# Patient Record
Sex: Male | Born: 1965 | Race: White | Hispanic: No | Marital: Single | Smoking: Current every day smoker
Health system: Southern US, Community
[De-identification: ages and names within clinical notes are randomized; demographics above are authoritative.]

## PROBLEM LIST (undated history)

## (undated) HISTORY — PX: BACK SURGERY: SHX140

---

## 1999-07-20 ENCOUNTER — Encounter: Payer: Self-pay | Admitting: Emergency Medicine

## 1999-07-20 ENCOUNTER — Emergency Department (HOSPITAL_COMMUNITY): Admission: EM | Admit: 1999-07-20 | Discharge: 1999-07-20 | Payer: Self-pay | Admitting: Emergency Medicine

## 2004-03-05 ENCOUNTER — Emergency Department: Payer: Self-pay | Admitting: Emergency Medicine

## 2004-12-14 ENCOUNTER — Emergency Department: Payer: Self-pay | Admitting: Internal Medicine

## 2005-06-11 ENCOUNTER — Emergency Department: Payer: Self-pay | Admitting: Emergency Medicine

## 2007-07-19 ENCOUNTER — Emergency Department: Payer: Self-pay | Admitting: Emergency Medicine

## 2008-01-25 ENCOUNTER — Emergency Department: Payer: Self-pay | Admitting: Emergency Medicine

## 2008-07-10 ENCOUNTER — Emergency Department: Payer: Self-pay | Admitting: Internal Medicine

## 2009-03-31 ENCOUNTER — Emergency Department: Payer: Self-pay

## 2010-11-18 IMAGING — CR RIGHT FOREARM - 2 VIEW
1 series · 2 of 2 positions shown · non-contrast
Comparison: none

REASON FOR EXAM: deformity, pain, swelling
COMMENTS:

PROCEDURE:     DXR - DXR FOREARM RIGHT  - March 31, 2009  [DATE]
RESULT:     AP and lateral views of the right forearm reveal the bones to be
adequately mineralized. The observed portions of the elbow and wrist exhibit
no acute abnormality. The overlying soft tissues are normal in appearance.

[Series 1: view not recorded · 0.17mm/px · 2 of 2 slices shown]
[im 1/2]
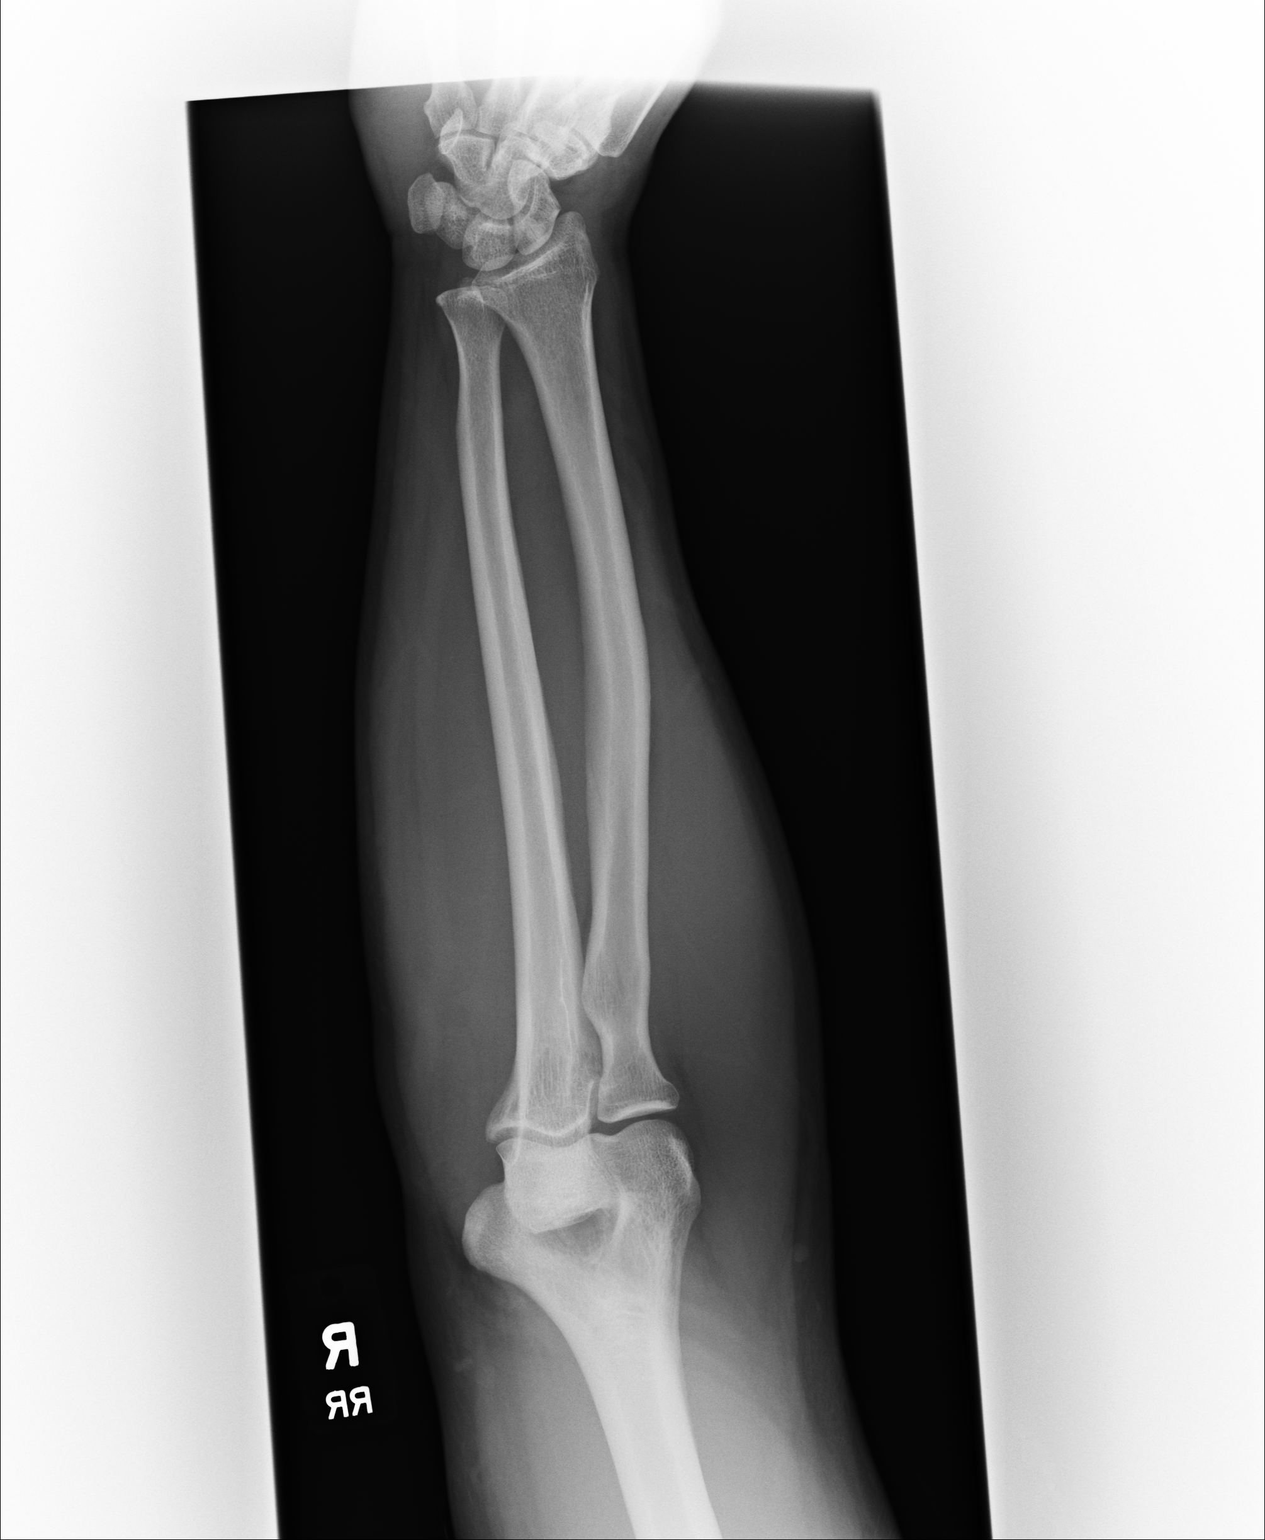
[im 2/2]
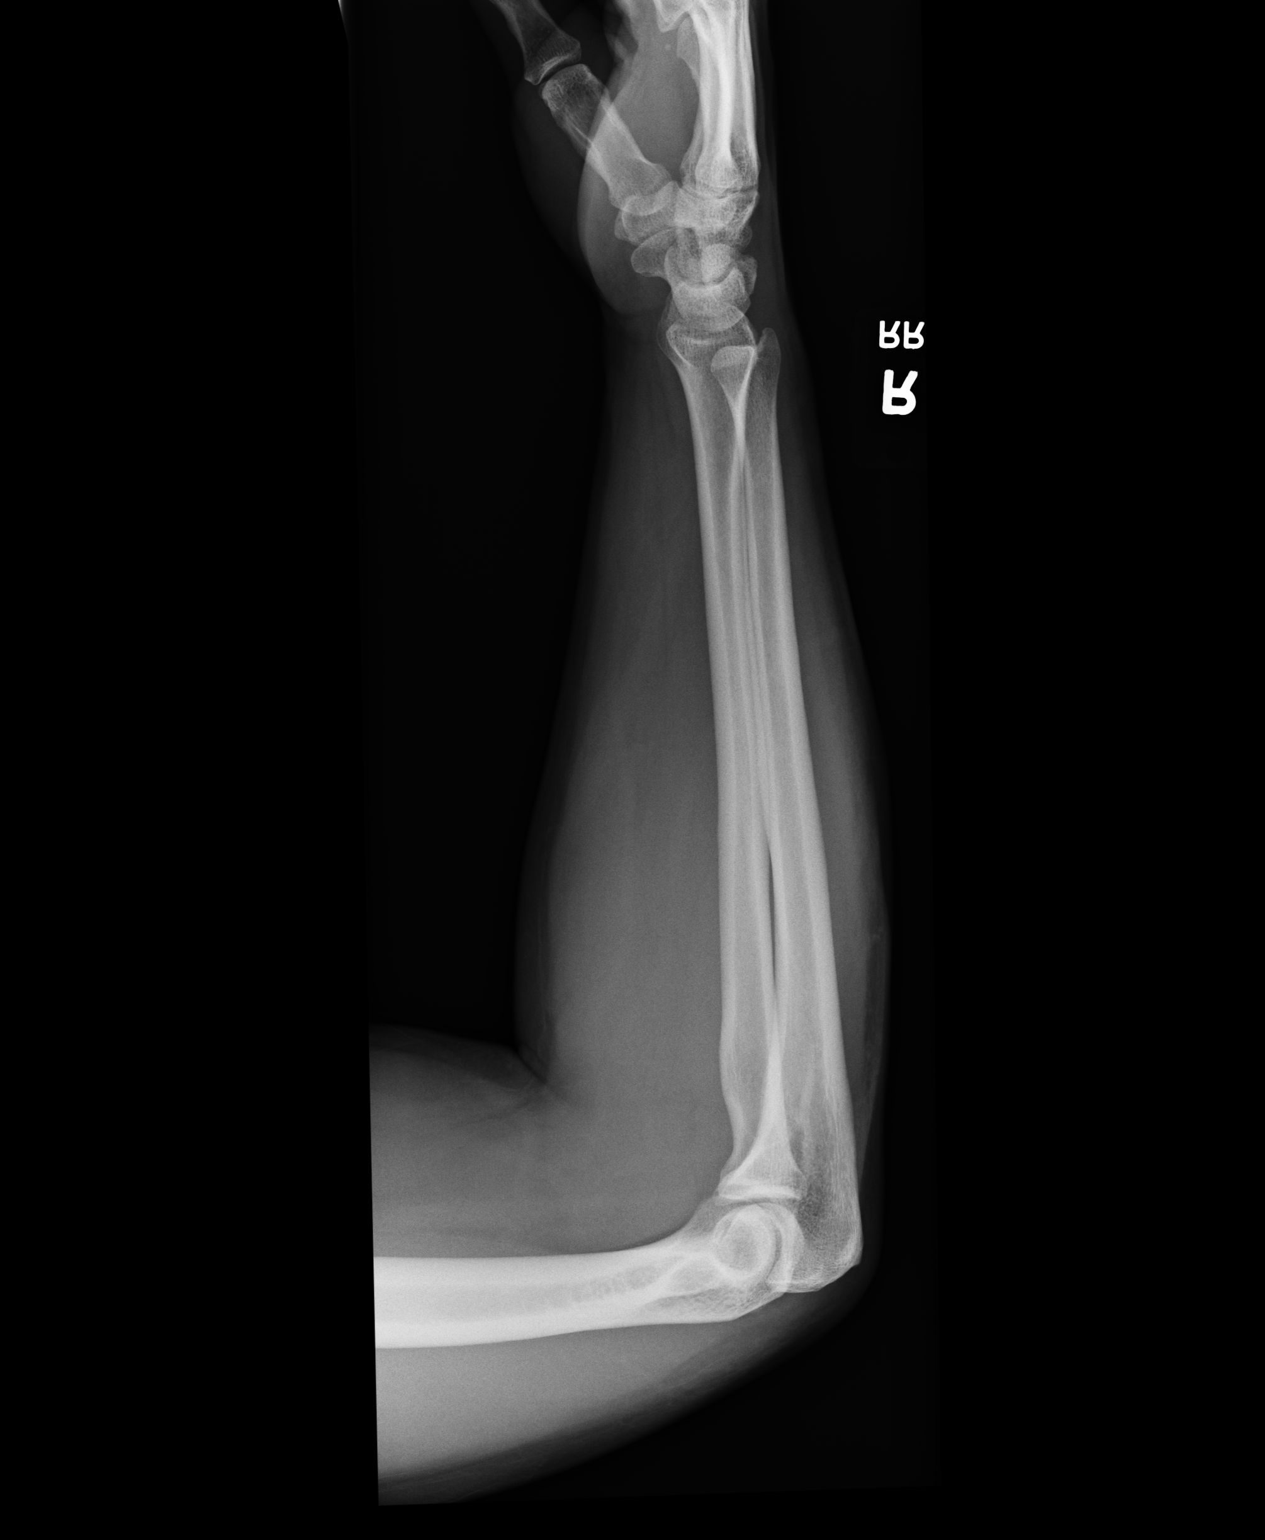

[2 of 2 positions shown; findings below may reference images not displayed]

IMPRESSION: I do not see objective evidence of an acute fracture nor
other acute abnormality of the radius or ulna on the right. No definite soft
tissue lesion is identified.

## 2010-12-05 ENCOUNTER — Emergency Department: Payer: Self-pay | Admitting: Emergency Medicine

## 2011-05-06 ENCOUNTER — Emergency Department: Payer: Self-pay | Admitting: Emergency Medicine

## 2013-06-06 ENCOUNTER — Emergency Department: Payer: Self-pay | Admitting: Emergency Medicine

## 2013-06-06 LAB — BASIC METABOLIC PANEL
Anion Gap: 4 — ABNORMAL LOW (ref 7–16)
BUN: 13 mg/dL (ref 7–18)
Calcium, Total: 9.7 mg/dL (ref 8.5–10.1)
Chloride: 99 mmol/L (ref 98–107)
Co2: 30 mmol/L (ref 21–32)
Creatinine: 0.78 mg/dL (ref 0.60–1.30)
EGFR (African American): >60
EGFR (Non-African Amer.): >60
Glucose: 123 mg/dL — ABNORMAL HIGH (ref 65–99)
Osmolality: 268 (ref 275–301)
Potassium: 3.7 mmol/L (ref 3.5–5.1)
Sodium: 133 mmol/L — ABNORMAL LOW (ref 136–145)

## 2013-06-06 LAB — CBC
HCT: 46.1 % (ref 40.0–52.0)
HGB: 15.1 g/dL (ref 13.0–18.0)
MCH: 30 pg (ref 26.0–34.0)
MCHC: 32.9 g/dL (ref 32.0–36.0)
MCV: 91 fL (ref 80–100)
Platelet: 301 10*3/uL (ref 150–440)
RBC: 5.05 10*6/uL (ref 4.40–5.90)
RDW: 13.1 % (ref 11.5–14.5)
WBC: 17.1 10*3/uL — ABNORMAL HIGH (ref 3.8–10.6)

## 2013-06-06 LAB — TROPONIN I: Troponin-I: <0.02 ng/mL

## 2013-09-27 ENCOUNTER — Emergency Department: Payer: Self-pay | Admitting: Emergency Medicine

## 2014-04-12 ENCOUNTER — Emergency Department: Payer: Self-pay | Admitting: Student

## 2015-01-24 IMAGING — CR DG CHEST 2V
1 series · 2 of 2 positions shown · non-contrast
Comparison: None.

CLINICAL DATA: Shortness of breath.

EXAM:
CHEST  2 VIEW

[Series 1: w chest pa · 0.14mm/px · 2 of 2 slices shown]
[im 1/2]
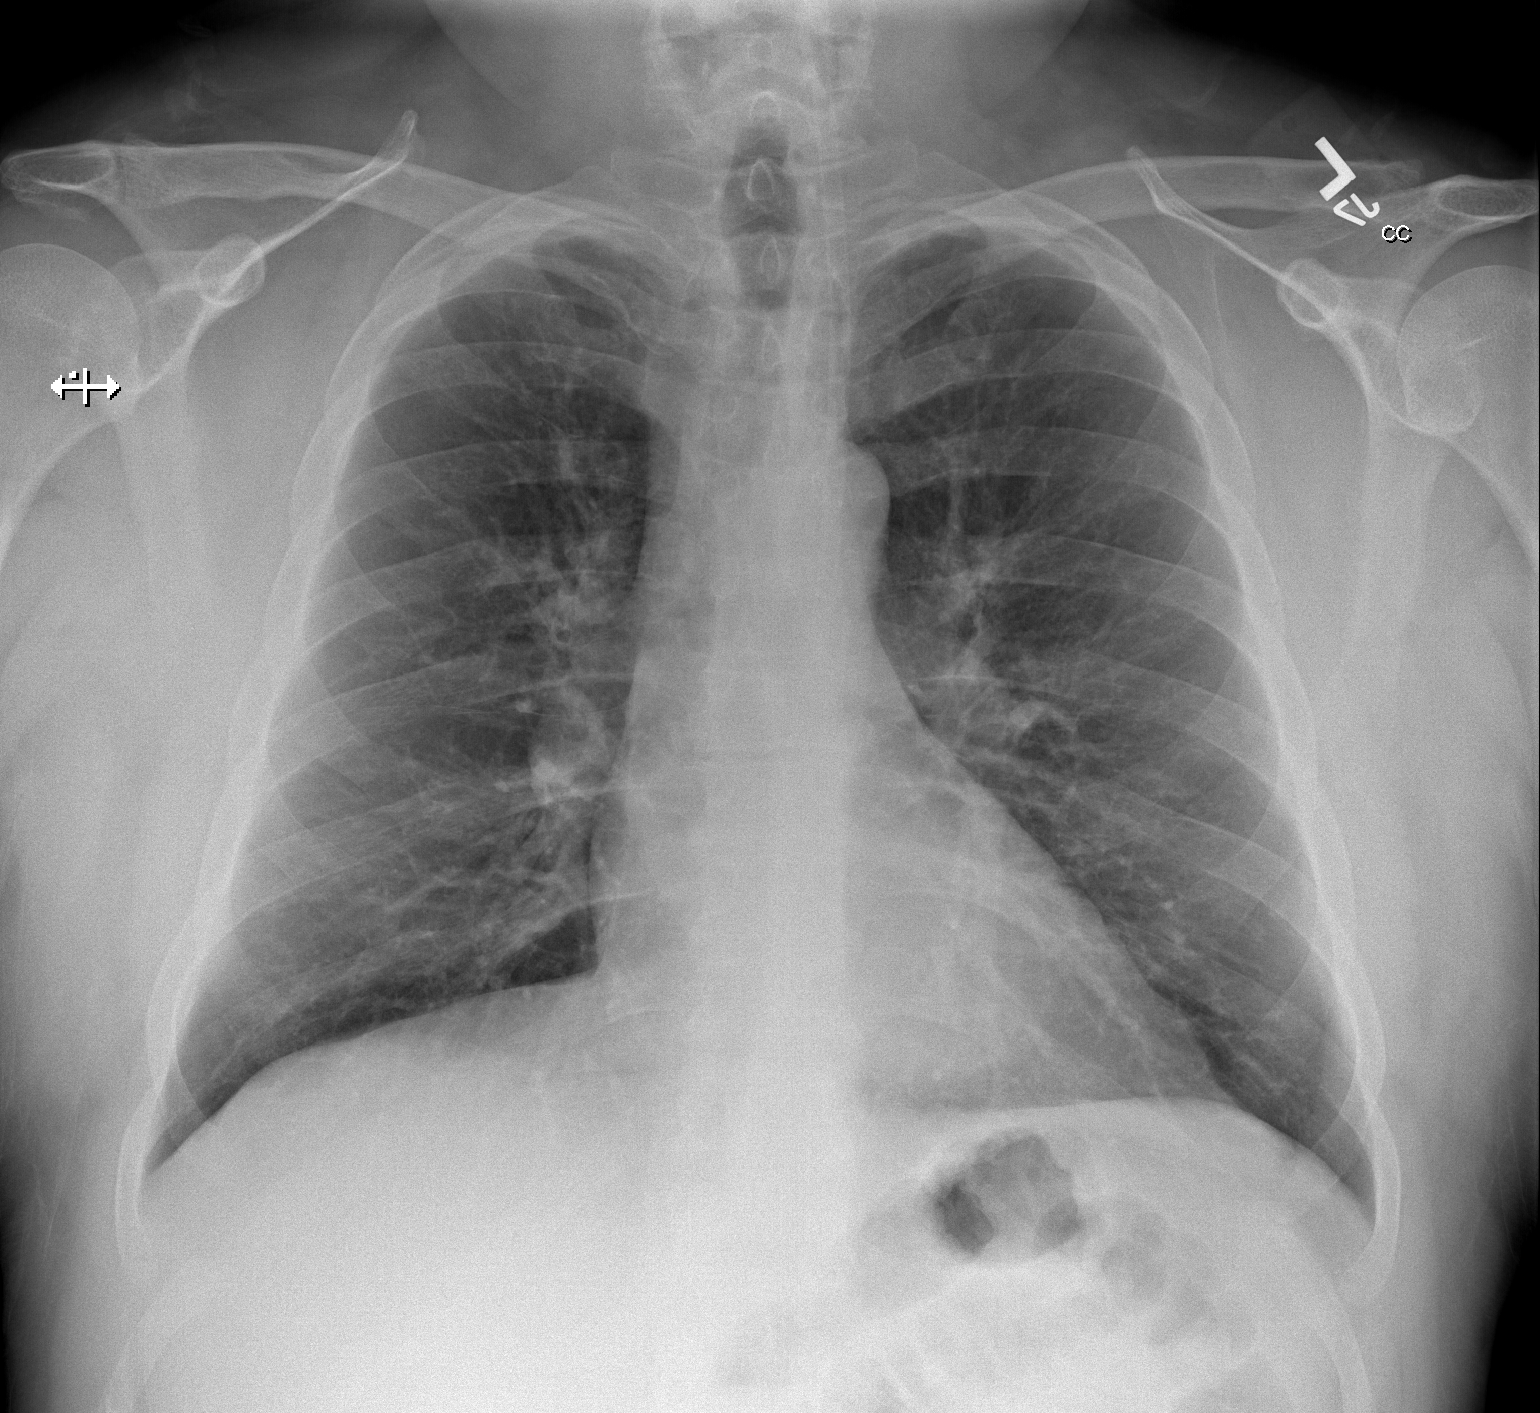
[im 2/2]
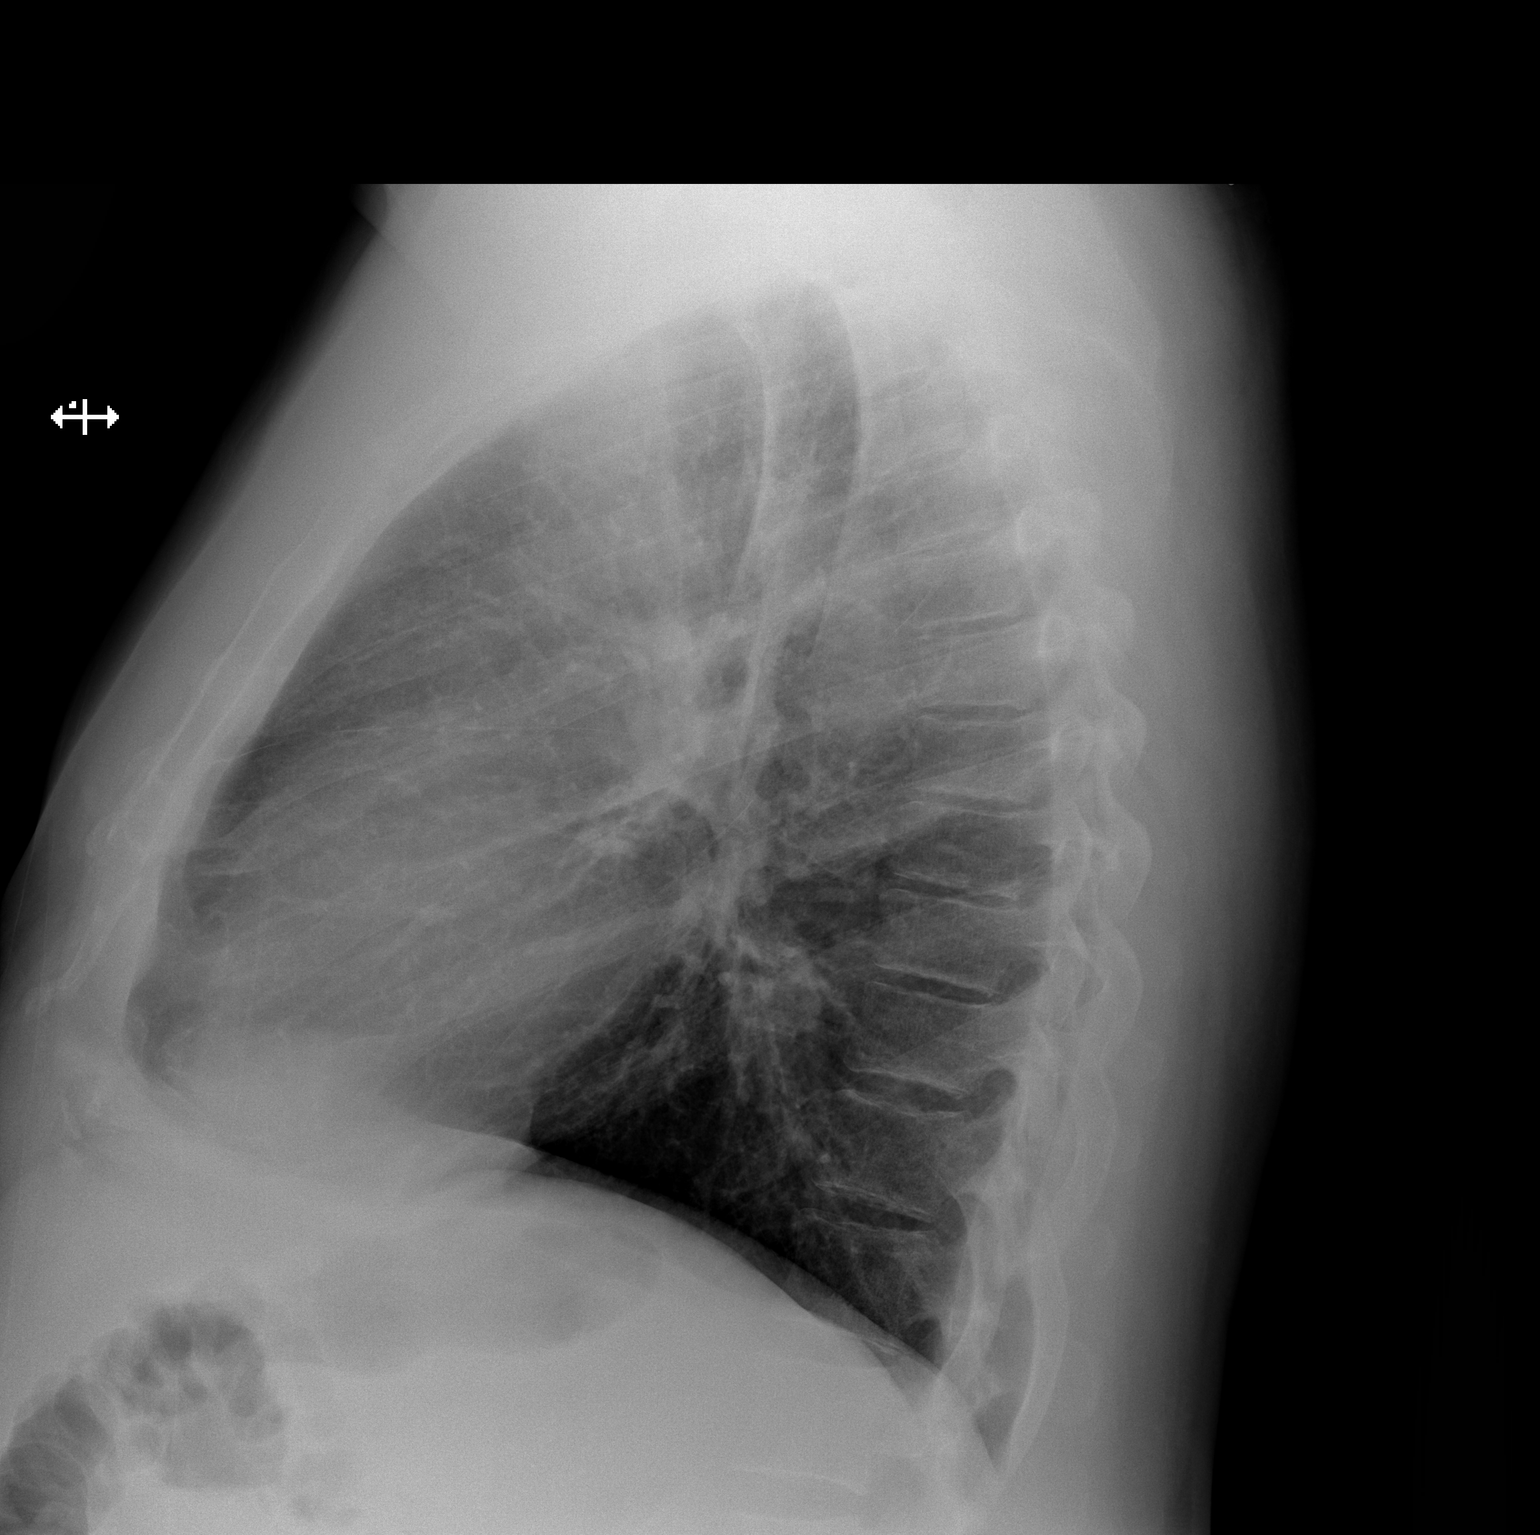

[2 of 2 positions shown; findings below may reference images not displayed]

FINDINGS: The cardiac silhouette, mediastinal and hilar contours are within
normal limits. There is mild peribronchial thickening and increased
interstitial markings which could be related to smoking or
bronchitis. No infiltrates or effusions. The bony thorax is intact.
IMPRESSION: Bronchitic type lung changes could be related to smoking or
bronchitis. No focal infiltrates.

## 2015-09-10 ENCOUNTER — Emergency Department
Admission: EM | Admit: 2015-09-10 | Discharge: 2015-09-10 | Disposition: A | Discharge status: Home / Self Care | Payer: Self-pay | Attending: Emergency Medicine | Admitting: Emergency Medicine

## 2015-09-10 ENCOUNTER — Emergency Department: Payer: Self-pay

## 2015-09-10 ENCOUNTER — Encounter: Payer: Self-pay | Admitting: Emergency Medicine

## 2015-09-10 DIAGNOSIS — F17210 Nicotine dependence, cigarettes, uncomplicated: Secondary | ICD-10-CM | POA: Insufficient documentation

## 2015-09-10 DIAGNOSIS — S91012A Laceration without foreign body, left ankle, initial encounter: Secondary | ICD-10-CM | POA: Insufficient documentation

## 2015-09-10 DIAGNOSIS — Y990 Civilian activity done for income or pay: Secondary | ICD-10-CM | POA: Insufficient documentation

## 2015-09-10 DIAGNOSIS — Y929 Unspecified place or not applicable: Secondary | ICD-10-CM | POA: Insufficient documentation

## 2015-09-10 DIAGNOSIS — W268XXA Contact with other sharp object(s), not elsewhere classified, initial encounter: Secondary | ICD-10-CM | POA: Insufficient documentation

## 2015-09-10 DIAGNOSIS — Y9389 Activity, other specified: Secondary | ICD-10-CM | POA: Insufficient documentation

## 2015-09-10 MED ORDER — IBUPROFEN 800 MG PO TABS
800.0000 mg | ORAL_TABLET | Freq: Once | ORAL | Status: AC
  Administered 2015-09-10: 800 mg via ORAL
  Filled 2015-09-10: qty 1

## 2015-09-10 MED ORDER — LIDOCAINE HCL (PF) 1 % IJ SOLN
INTRAMUSCULAR | Status: AC
  Administered 2015-09-10: 5 mL via INTRADERMAL
  Filled 2015-09-10: qty 5

## 2015-09-10 MED ORDER — OXYCODONE-ACETAMINOPHEN 5-325 MG PO TABS
1.0000 | ORAL_TABLET | Freq: Once | ORAL | Status: AC
  Administered 2015-09-10: 1 via ORAL
  Filled 2015-09-10: qty 1

## 2015-09-10 MED ORDER — SULFAMETHOXAZOLE-TRIMETHOPRIM 800-160 MG PO TABS: 1.0000 | ORAL_TABLET | Freq: Two times a day (BID) | ORAL | Status: AC

## 2015-09-10 MED ORDER — SULFAMETHOXAZOLE-TRIMETHOPRIM 800-160 MG PO TABS
1.0000 | ORAL_TABLET | Freq: Once | ORAL | Status: AC
  Administered 2015-09-10: 1 via ORAL
  Filled 2015-09-10: qty 1

## 2015-09-10 MED ORDER — LIDOCAINE HCL (PF) 1 % IJ SOLN
5.0000 mL | Freq: Once | INTRAMUSCULAR | Status: AC
  Administered 2015-09-10: 5 mL via INTRADERMAL

## 2015-09-10 MED ORDER — TRAMADOL HCL 50 MG PO TABS: 50.0000 mg | ORAL_TABLET | Freq: Four times a day (QID) | ORAL | Status: AC | PRN

## 2015-09-10 NOTE — Discharge Instructions (Signed)
Laceration Care, Adult  A laceration is a cut that goes through all layers of the skin. The cut also goes into the tissue that is right under the skin. Some cuts heal on their own. Others need to be closed with stitches (sutures), staples, skin adhesive strips, or wound glue. Taking care of your cut lowers your risk of infection and helps your cut to heal better.  HOW TO TAKE CARE OF YOUR CUT  For stitches or staples:  · Keep the wound clean and dry.  · If you were given a bandage (dressing), you should change it at least one time per day or as told by your doctor. You should also change it if it gets wet or dirty.  · Keep the wound completely dry for the first 24 hours or as told by your doctor. After that time, you may take a shower or a bath. However, make sure that the wound is not soaked in water until after the stitches or staples have been removed.  · Clean the wound one time each day or as told by your doctor:    Wash the wound with soap and water.    Rinse the wound with water until all of the soap comes off.    Pat the wound dry with a clean towel. Do not rub the wound.  · After you clean the wound, put a thin layer of antibiotic ointment on it as told by your doctor. This ointment:    Helps to prevent infection.    Keeps the bandage from sticking to the wound.  · Have your stitches or staples removed as told by your doctor.  If your doctor used skin adhesive strips:   · Keep the wound clean and dry.  · If you were given a bandage, you should change it at least one time per day or as told by your doctor. You should also change it if it gets dirty or wet.  · Do not get the skin adhesive strips wet. You can take a shower or a bath, but be careful to keep the wound dry.  · If the wound gets wet, pat it dry with a clean towel. Do not rub the wound.  · Skin adhesive strips fall off on their own. You can trim the strips as the wound heals. Do not remove any strips that are still stuck to the wound. They will  fall off after a while.  If your doctor used wound glue:  · Try to keep your wound dry, but you may briefly wet it in the shower or bath. Do not soak the wound in water, such as by swimming.  · After you take a shower or a bath, gently pat the wound dry with a clean towel. Do not rub the wound.  · Do not do any activities that will make you really sweaty until the skin glue has fallen off on its own.  · Do not apply liquid, cream, or ointment medicine to your wound while the skin glue is still on.  · If you were given a bandage, you should change it at least one time per day or as told by your doctor. You should also change it if it gets dirty or wet.  · If a bandage is placed over the wound, do not let the tape for the bandage touch the skin glue.  · Do not pick at the glue. The skin glue usually stays on for 5-10 days. Then, it   falls off of the skin.  General Instructions   · To help prevent scarring, make sure to cover your wound with sunscreen whenever you are outside after stitches are removed, after adhesive strips are removed, or when wound glue stays in place and the wound is healed. Make sure to wear a sunscreen of at least 30 SPF.  · Take over-the-counter and prescription medicines only as told by your doctor.  · If you were given antibiotic medicine or ointment, take or apply it as told by your doctor. Do not stop using the antibiotic even if your wound is getting better.  · Do not scratch or pick at the wound.  · Keep all follow-up visits as told by your doctor. This is important.  · Check your wound every day for signs of infection. Watch for:    Redness, swelling, or pain.    Fluid, blood, or pus.  · Raise (elevate) the injured area above the level of your heart while you are sitting or lying down, if possible.  GET HELP IF:  · You got a tetanus shot and you have any of these problems at the injection site:    Swelling.    Very bad pain.    Redness.    Bleeding.  · You have a fever.  · A wound that was  closed breaks open.  · You notice a bad smell coming from your wound or your bandage.  · You notice something coming out of the wound, such as wood or glass.  · Medicine does not help your pain.  · You have more redness, swelling, or pain at the site of your wound.  · You have fluid, blood, or pus coming from your wound.  · You notice a change in the color of your skin near your wound.  · You need to change the bandage often because fluid, blood, or pus is coming from the wound.  · You start to have a new rash.  · You start to have numbness around the wound.  GET HELP RIGHT AWAY IF:  · You have very bad swelling around the wound.  · Your pain suddenly gets worse and is very bad.  · You notice painful lumps near the wound or on skin that is anywhere on your body.  · You have a red streak going away from your wound.  · The wound is on your hand or foot and you cannot move a finger or toe like you usually can.  · The wound is on your hand or foot and you notice that your fingers or toes look pale or bluish.     This information is not intended to replace advice given to you by your health care provider. Make sure you discuss any questions you have with your health care provider.     Document Released: 10/01/2007 Document Revised: 08/29/2014 Document Reviewed: 04/10/2014  [employer]vier Interactive Patient Education ©2016 [employer]

## 2015-09-10 NOTE — ED Notes (Signed)
Patient ambulatory to triage with steady gait, without difficulty or distress noted; pt reports at 10am cut left lower leg on saw while at work; declines desire to file workers comp at this time

## 2015-09-10 NOTE — ED Provider Notes (Signed)
[facility] Emergency Department Provider Note   ____________________________________________  Time seen: Approximately 8:36 PM  I have reviewed the triage vital signs and the nursing notes.   HISTORY  Chief Complaint Laceration    HPI Joe Gentry is a 50 y.o. male patient complained of pain secondary to a chain saw while cutting a metal bar. Incident occurred at roughly 11 hours ago. Patient state hemorrhage is controlled with direct pressure. Patient stated pain has increased over the last 3 hours. No other palliative measures for his complaint. Incident occurred at work. Patient does not want to file Worker's Compensation at this time. Patient rates his pain discomfort as 8/10. Patient described a pain as "sharp".   History reviewed. No pertinent past medical history.  There are no active problems to display for this patient.   Past Surgical History  Procedure Laterality Date  . Back surgery      Current Outpatient Rx  Name  Route  Sig  Dispense  Refill  . sulfamethoxazole-trimethoprim (BACTRIM DS,SEPTRA DS) 800-160 MG tablet   Oral   Take 1 tablet by mouth 2 (two) times daily.   20 tablet   0   . traMADol (ULTRAM) 50 MG tablet   Oral   Take 1 tablet (50 mg total) by mouth every 6 (six) hours as needed for moderate pain.   12 tablet   0     Allergies Review of patient's allergies indicates no known allergies.  History reviewed. No pertinent family history.  Social History Social History  Substance Use Topics  . Smoking status: Current Every Day Smoker -- 1.00 packs/day    Types: Cigarettes  . Smokeless tobacco: None  . Alcohol Use: Yes     Comment: "couple a day"    Review of Systems Constitutional: No fever/chills Eyes: No visual changes. ENT: No sore throat. Cardiovascular: Denies chest pain. Respiratory: Denies shortness of breath. Gastrointestinal: No abdominal pain.  No nausea, no vomiting.  No  diarrhea.  No constipation. Genitourinary: Negative for dysuria. Musculoskeletal: Negative for back pain. Skin: Negative for rash.Left ankle laceration Neurological: Negative for headaches, focal weakness or numbness.    ____________________________________________   PHYSICAL EXAM:  VITAL SIGNS: ED Triage Vitals  Enc Vitals Group     BP 09/10/15 1940 150/78 mmHg     Pulse Rate 09/10/15 1940 89     Resp 09/10/15 1940 20     Temp 09/10/15 1940 97.9 F (36.6 C)     Temp Source 09/10/15 1940 Oral     SpO2 09/10/15 1940 95 %     Weight 09/10/15 1940 240 lb (108.863 kg)     Height 09/10/15 1940 5\' 7"  (1.702 m)     Head Cir --      Peak Flow --      Pain Score 09/10/15 1940 8     Pain Loc --      Pain Edu? --      Excl. in GC? --     Constitutional: Alert and oriented. Well appearing and in no acute distress. Eyes: Conjunctivae are normal. PERRL. EOMI. Head: Atraumatic. Nose: No congestion/rhinnorhea. Mouth/Throat: Mucous membranes are moist.  Oropharynx non-erythematous. Neck: No stridor.  No cervical spine tenderness to palpation. Hematological/Lymphatic/Immunilogical: No cervical lymphadenopathy. Cardiovascular: Normal rate, regular rhythm. Grossly normal heart sounds.  Good peripheral circulation. Respiratory: Normal respiratory effort.  No retractions. Lungs CTAB. Gastrointestinal: Soft and nontender. No distention. No abdominal bruits. No CVA tenderness. Musculoskeletal: No lower extremity  tenderness nor edema.  No joint effusions. Neurologic:  Normal speech and language. No gross focal neurologic deficits are appreciated. No gait instability. Skin:  Skin is warm, dry and intact. No rash noted.He'll 0.5 cm laceration left medial ankle Psychiatric: Mood and affect are normal. Speech and behavior are normal.  ____________________________________________   LABS (all labs ordered are listed, but only abnormal results are displayed)  Labs Reviewed - No data to  display ____________________________________________  EKG   ____________________________________________  RADIOLOGY  No acute findings or foreign body on x-ray of the left ankle. ____________________________________________   PROCEDURES  Procedure(s) performed: LACERATION REPAIR Performed by: Joni Reining Authorized by: Joni Reining Consent: Verbal consent obtained. Risks and benefits: risks, benefits and alternatives were discussed Consent given by: patient Patient identity confirmed: provided demographic data Prepped and Draped in normal sterile fashion Wound explored  Laceration Location: Left medial ankle  Laceration Length: 0.5cm  No Foreign Bodies seen or palpated  Anesthesia: local infiltration  Local anesthetic: lidocaine 1% without epinephrine  Anesthetic total: 2 ml  Irrigation method: syringe Amount of cleaning: standard  Skin closure: 4-0 nylon   Number of sutures: 3 Technique: Interrupted Patient tolerance: Patient tolerated the procedure well with no immediate complications.   Critical Care performed: No  ____________________________________________   INITIAL IMPRESSION / ASSESSMENT AND PLAN / ED COURSE  Pertinent labs & imaging results that were available during my care of the patient were reviewed by me and considered in my medical decision making (see chart for details).  Left ankle laceration. Patient given discharge care instructions. Patient given a prescription for Bactrim DS and tramadol. Patient advised to have sutures removed by this department or urgent care clinic in 10 days. ____________________________________________   FINAL CLINICAL IMPRESSION(S) / ED DIAGNOSES  Final diagnoses:  Laceration of left ankle, initial encounter      NEW MEDICATIONS STARTED DURING THIS VISIT:  New Prescriptions   SULFAMETHOXAZOLE-TRIMETHOPRIM (BACTRIM DS,SEPTRA DS) 800-160 MG TABLET    Take 1 tablet by mouth 2 (two) times daily.    TRAMADOL (ULTRAM) 50 MG TABLET    Take 1 tablet (50 mg total) by mouth every 6 (six) hours as needed for moderate pain.     Note:  This document was prepared using Dragon voice recognition software and may include unintentional dictation errors.    Joni Reining, PA-C 09/10/15 2203  Phineas Semen, MD 09/10/15 2250

## 2015-09-10 NOTE — ED Notes (Addendum)
Pt states he was working with a sawzall this AM when the equipment slipped and went into the pts left ankle. Puncture wound noted on assessment, no bleeding, swelling and tenderness present.

## 2016-01-27 DEATH — deceased

## 2017-04-29 IMAGING — DX DG ANKLE COMPLETE 3+V*L*
3 series · 3 of 3 positions shown · non-contrast
Comparison: None

CLINICAL DATA: Struck medial LEFT ankle with reciprocating saw that
with still running after cutting through a metal rod, laceration,
initial encounter

EXAM:
LEFT ANKLE COMPLETE - 3+ VIEW

[ankle lat]
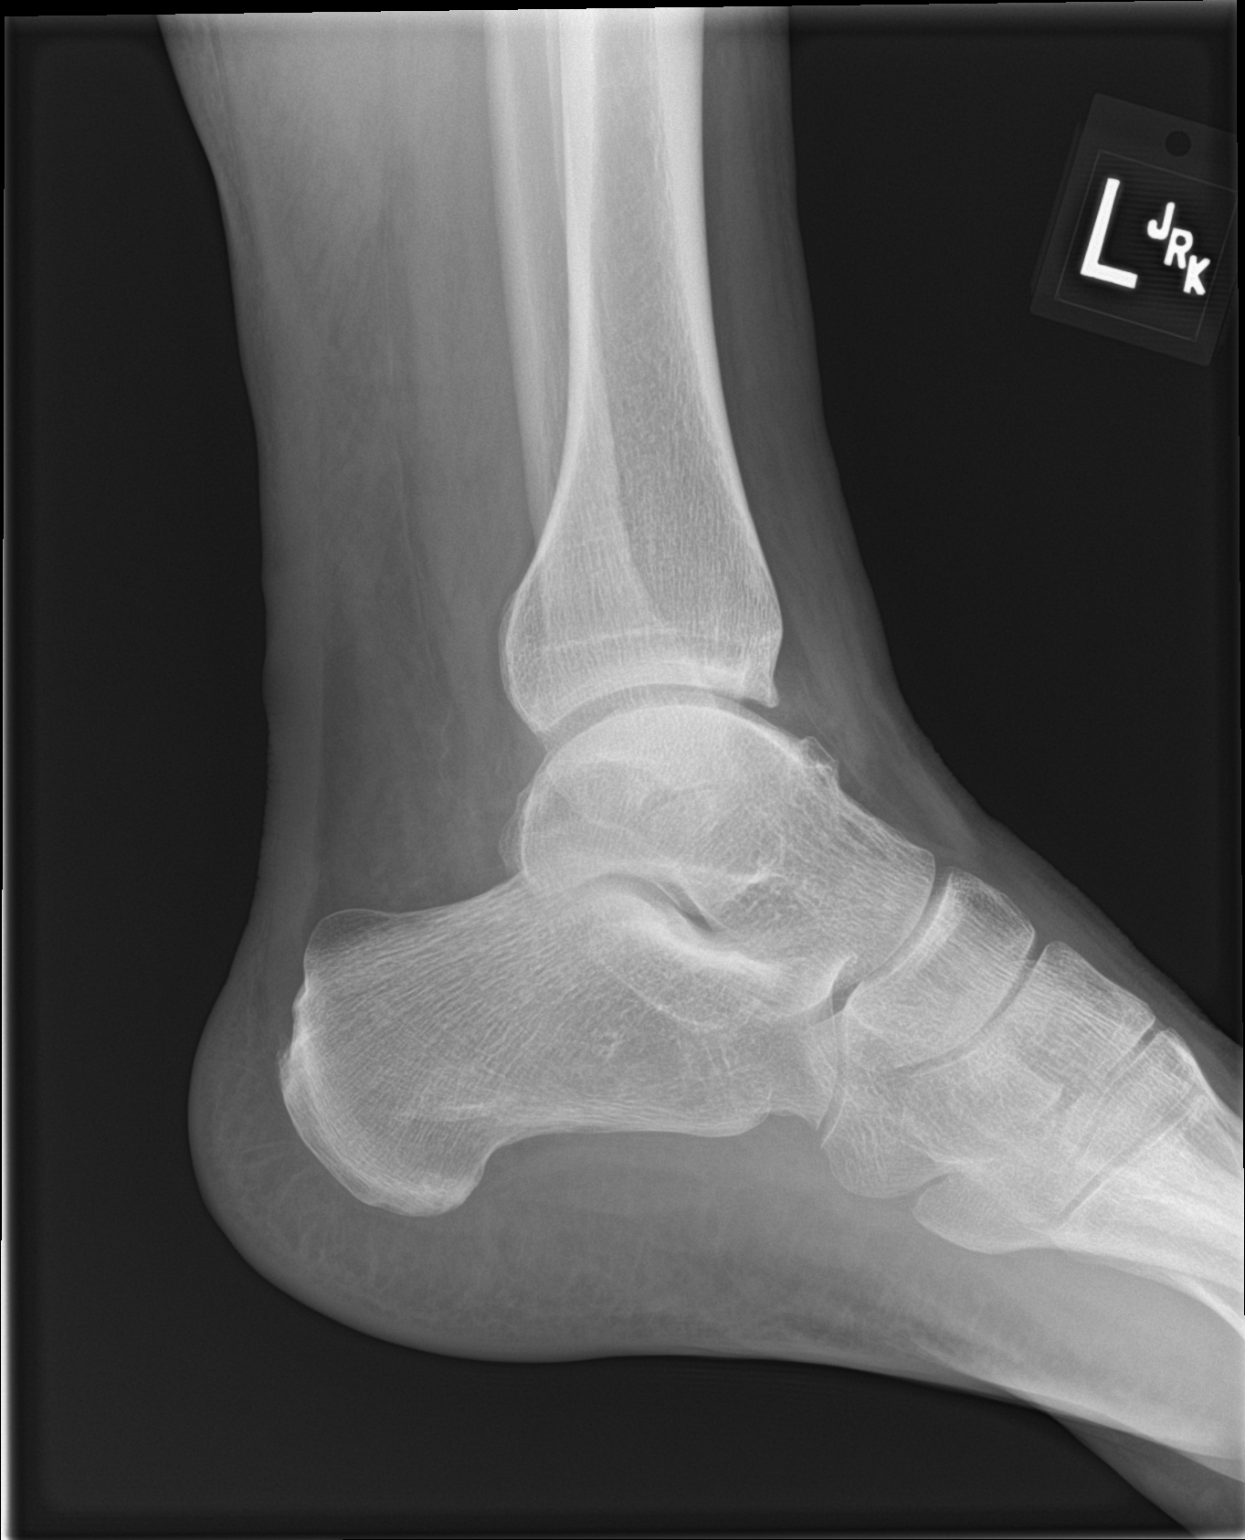

[ankle ap]
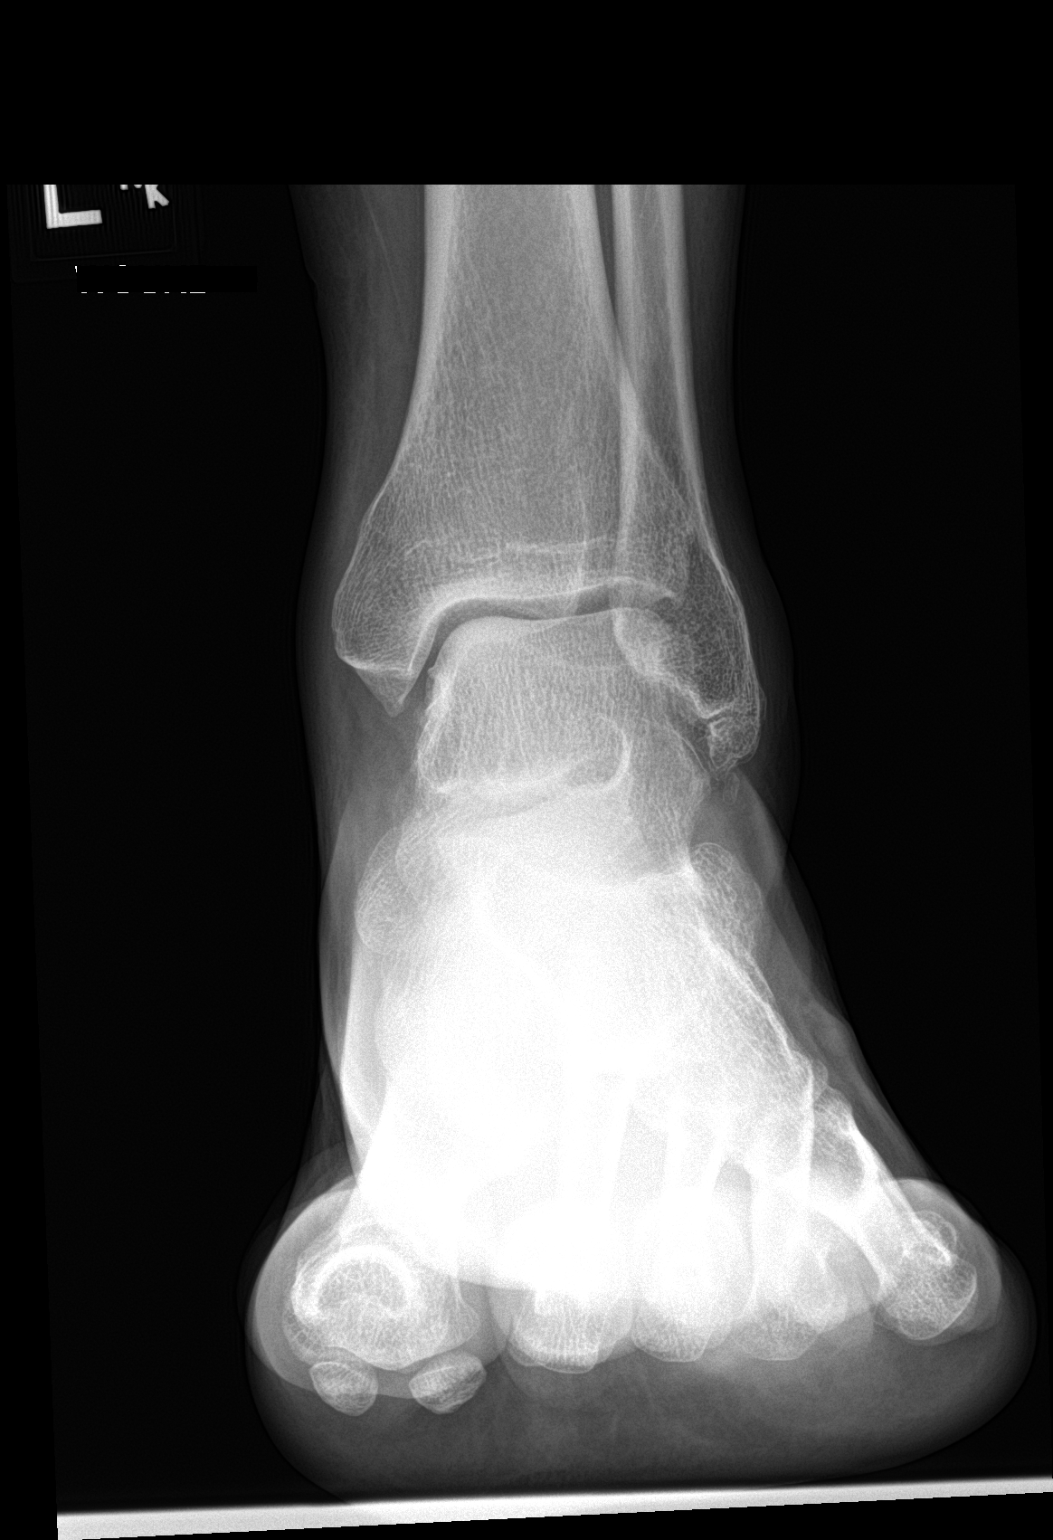

[ankle obl]
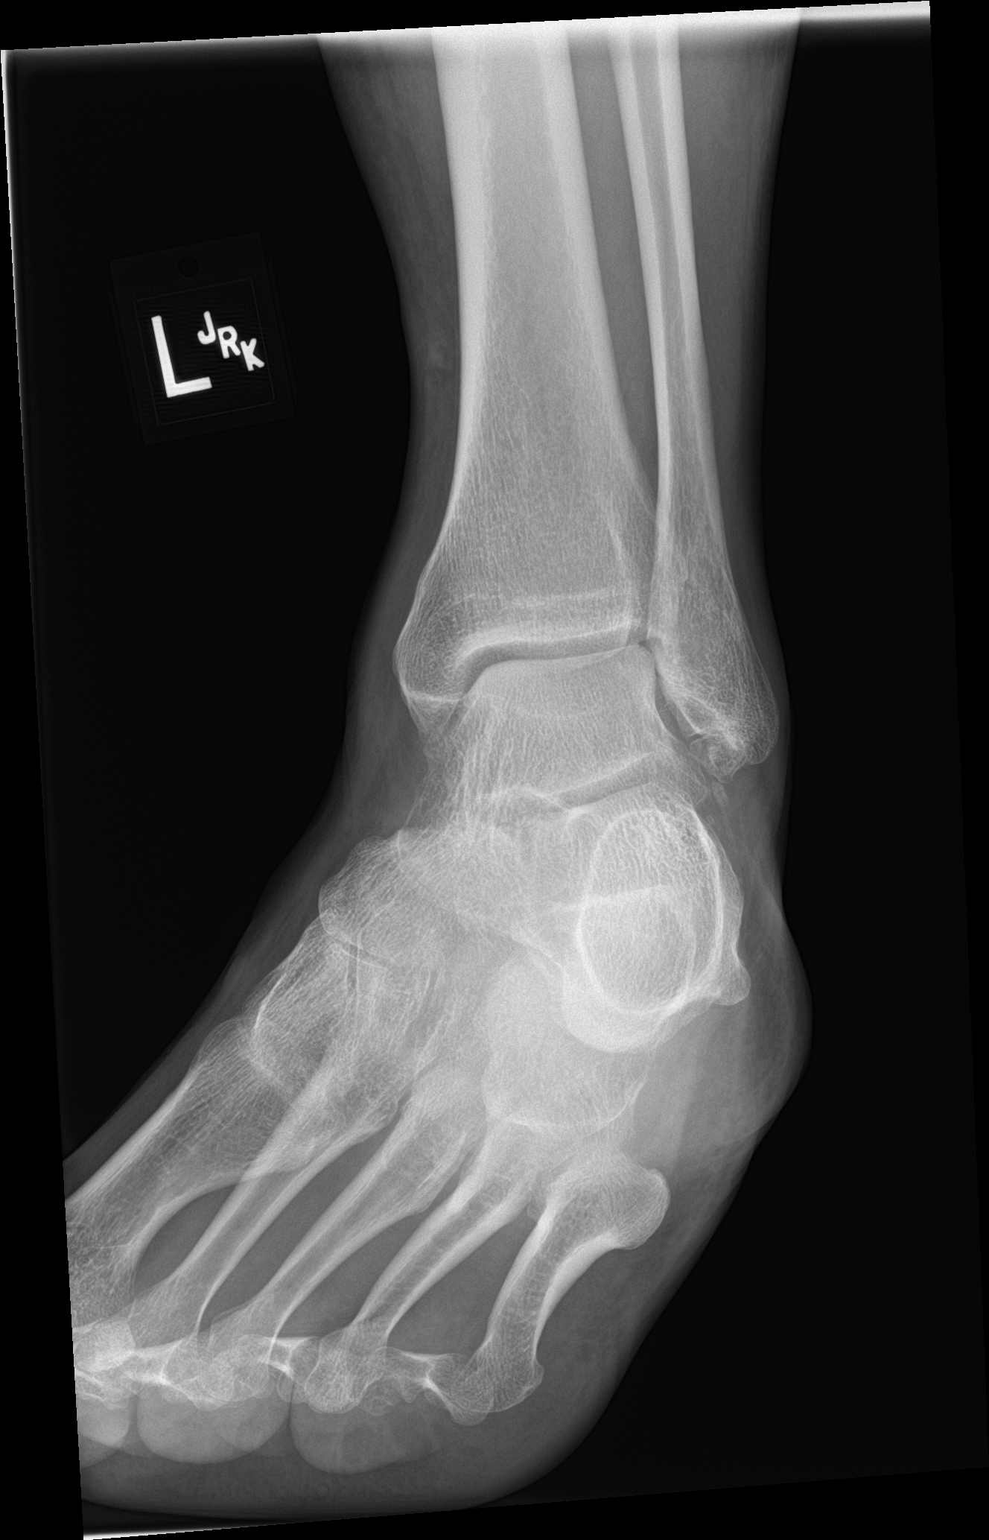

[3 of 3 positions shown; findings below may reference images not displayed]

FINDINGS: Osseous mineralization normal.

Ankle mortise intact.

Minimal spurring and tiny non fused ossicle at tip of lateral
malleolus.

No acute fracture, dislocation, or bone destruction.

No definite radiopaque foreign bodies.
IMPRESSION: No acute abnormalities.
